# Patient Record
Sex: Male | Born: 1965 | Race: White | Hispanic: No | Marital: Married | Smoking: Former smoker
Health system: Southern US, Community
[De-identification: ages and names within clinical notes are randomized; demographics above are authoritative.]

---

## 2010-04-25 ENCOUNTER — Encounter: Admission: RE | Admit: 2010-04-25 | Discharge: 2010-04-25 | Payer: Self-pay | Admitting: Family Medicine

## 2010-11-16 ENCOUNTER — Encounter: Payer: Self-pay | Admitting: Family Medicine

## 2011-11-23 IMAGING — RF DG ESOPHAGUS
16 of 19 series · 19 of 24 positions shown · non-contrast
Comparison: None.

CLINICAL DATA: Dysphagia

ESOPHOGRAM/BARIUM SWALLOW
TECHNIQUE: Combined double contrast and single contrast
examination performed using effervescent crystals, thick barium
liquid, and thin barium liquid.
Fluoroscopy time:  1.8 minutes.

[Series 2: run · 1 of 1 slices shown (1 of 16)]
[im 1/1]
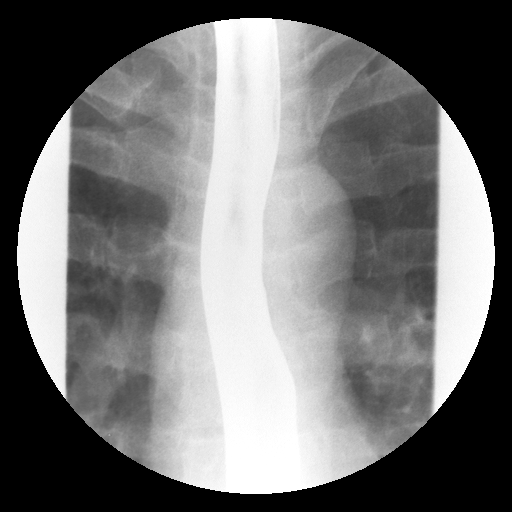

[Series 3: run · 1 of 1 slices shown (2 of 16)]
[im 1/1]
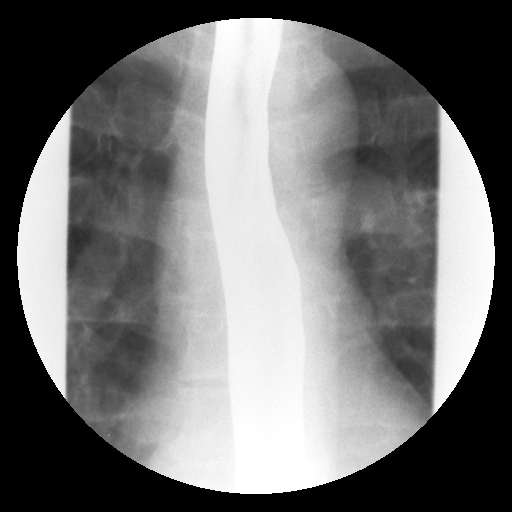

[Series 5: run · 1 of 1 slices shown (3 of 16)]
[im 1/1]
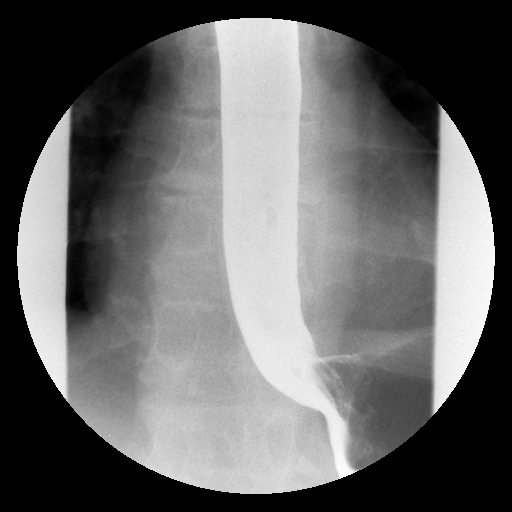

[Series 6: run · 1 of 1 slices shown (4 of 16)]
[im 1/1]
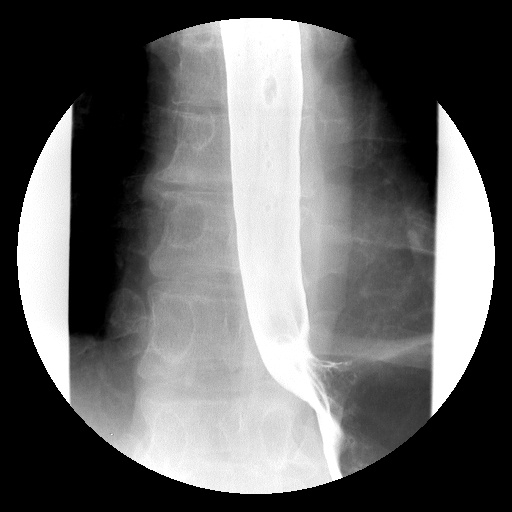

[Series 7: run · 1 of 1 slices shown (5 of 16)]
[im 1/1]
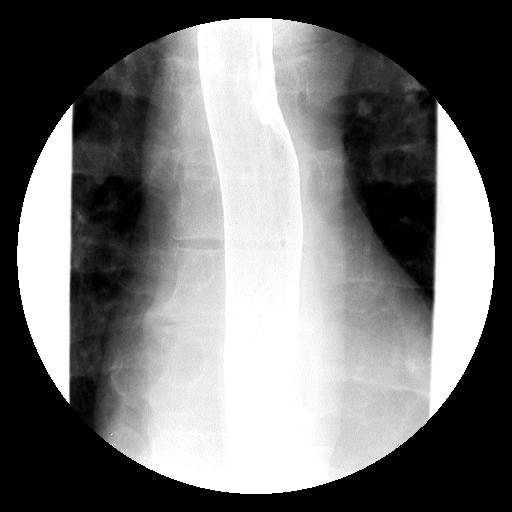

[Series 8: run · 1 of 1 slices shown (6 of 16)]
[im 1/1]
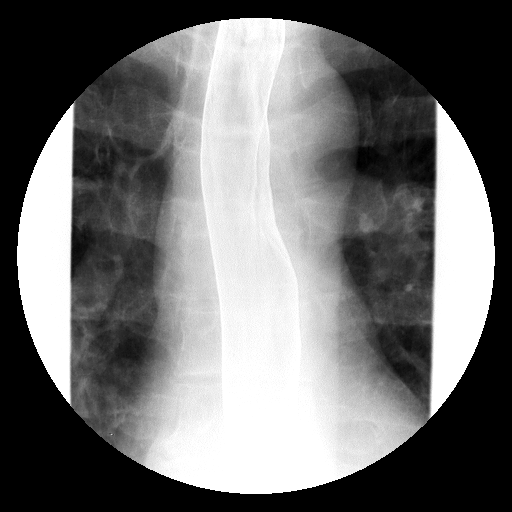

[Series 9: run · 3 of 7 slices shown (7 of 16)]
[im 3/7]
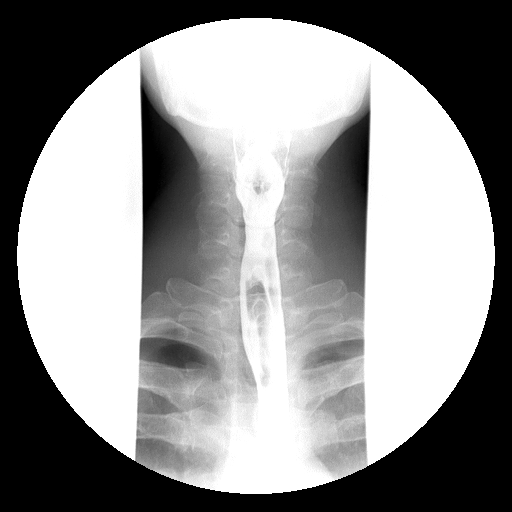
[im 5/7]
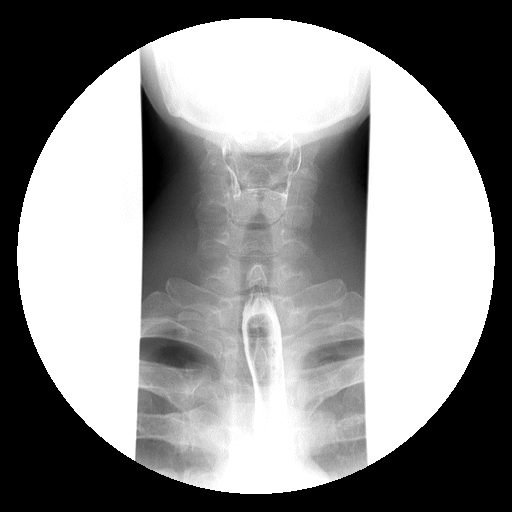
[im 7/7]
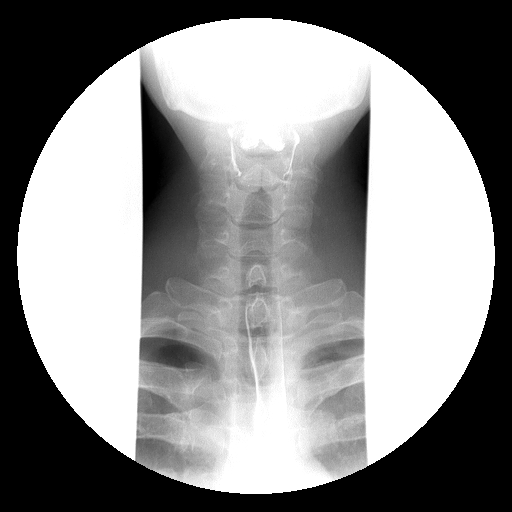

[Series 10: run · 2 of 5 slices shown (8 of 16)]
[im 3/5]
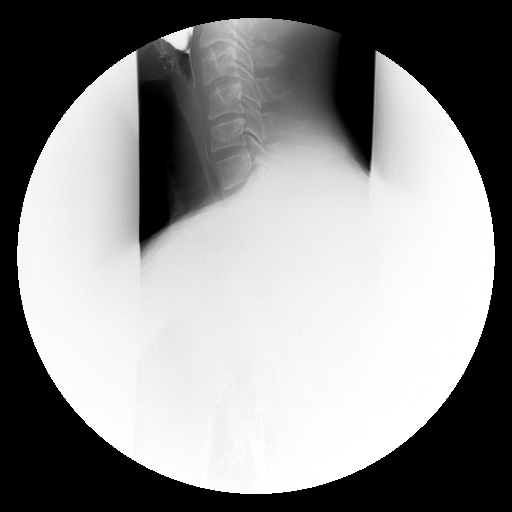
[im 5/5]
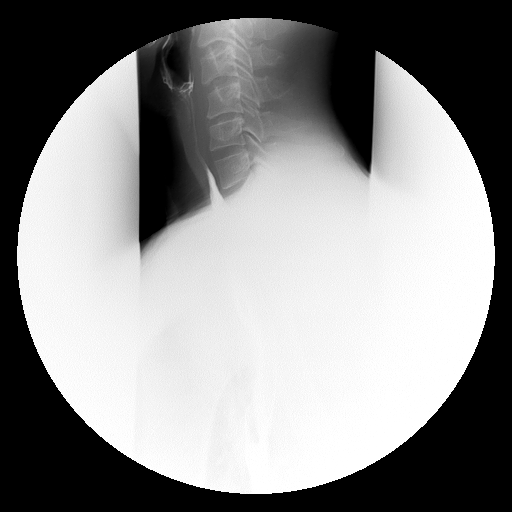

[Series 11: run · 1 of 1 slices shown (9 of 16)]
[im 1/1]
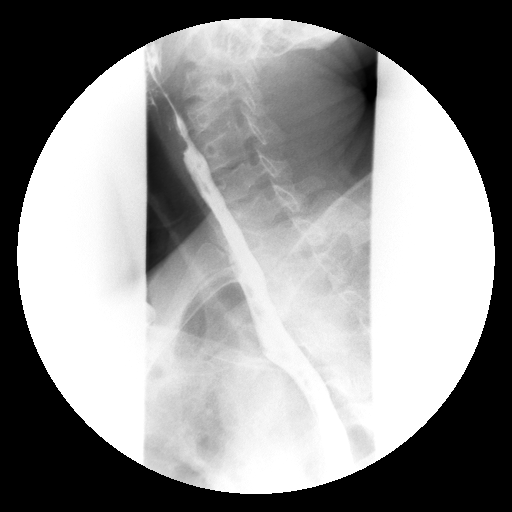

[Series 12: run · 1 of 1 slices shown (10 of 16)]
[im 1/1]
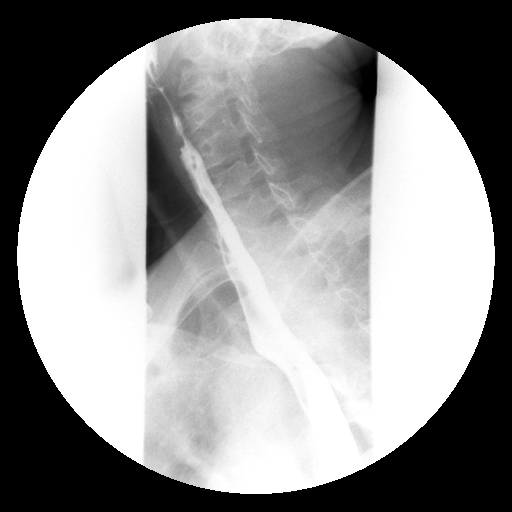

[Series 14: run · 1 of 1 slices shown (11 of 16)]
[im 1/1]
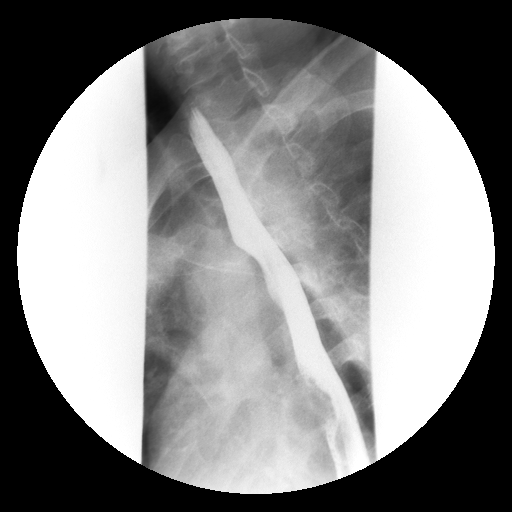

[Series 15: run · 1 of 1 slices shown (12 of 16)]
[im 1/1]
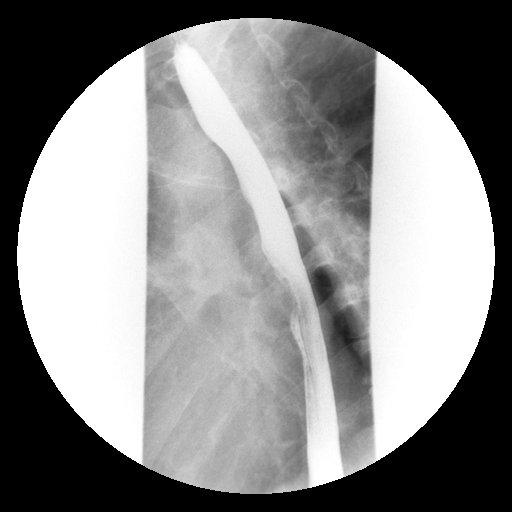

[Series 16: run · 1 of 1 slices shown (13 of 16)]
[im 1/1]
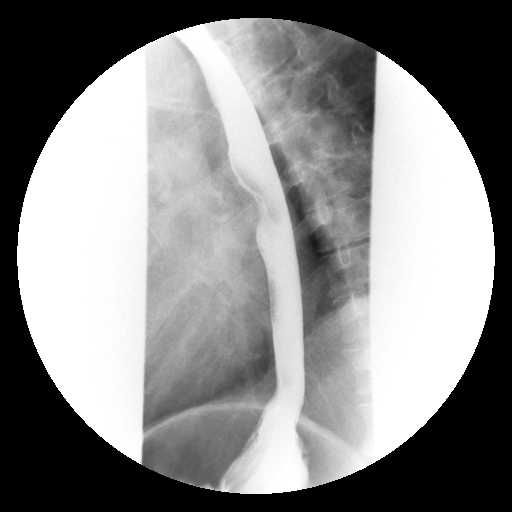

[Series 17: run · 1 of 1 slices shown (14 of 16)]
[im 1/1]
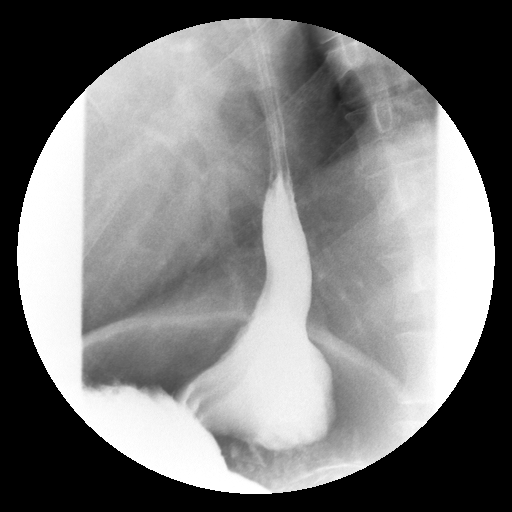

[Series 19: run · 1 of 1 slices shown (15 of 16)]
[im 1/1]
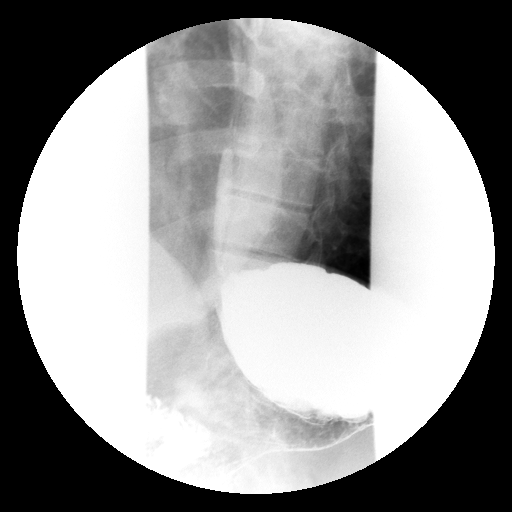

[Series 20: run · 1 of 1 slices shown (16 of 16)]
[im 1/1]
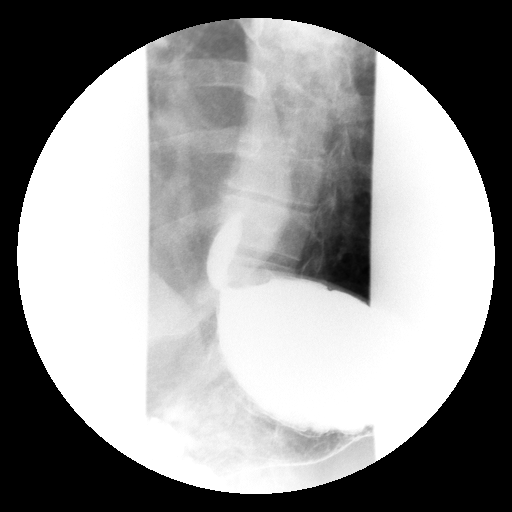

[19 of 24 positions shown; findings below may reference images not displayed]

FINDINGS: Initially a double contrast barium swallow was performed
and the mucosa of the esophagus appears normal.  A single contrast
study shows the swallowing mechanism to be normal.  Esophageal
peristalsis is normal.  No hiatal hernia is seen.  There is mild
gastroesophageal reflux demonstrated.  A barium pill was given at
the end of the study which did pass into the stomach without delay.
IMPRESSION: Mild gastroesophageal reflux.  The barium pill passes into the
stomach without delay.

## 2021-08-19 ENCOUNTER — Ambulatory Visit (AMBULATORY_SURGERY_CENTER): Payer: Self-pay | Admitting: *Deleted

## 2021-08-19 ENCOUNTER — Other Ambulatory Visit: Payer: Self-pay

## 2021-08-19 VITALS — Ht 69.0 in | Wt 150.0 lb

## 2021-08-19 DIAGNOSIS — Z1211 Encounter for screening for malignant neoplasm of colon: Secondary | ICD-10-CM

## 2021-08-19 MED ORDER — SUTAB 1479-225-188 MG PO TABS: 1.0000 | ORAL_TABLET | Freq: Once | ORAL | 0 refills | Status: AC

## 2021-08-19 NOTE — Progress Notes (Signed)
Pt's previsit is done over the phone and all paperwork (prep instructions, blank consent form to just read over) sent to patient.  Pt's name and DOB verified at the beginning of the previsit.  Pt denies any difficulty with ambulating.   No surgeries, no trouble moving neck   No egg or soy allergy  No home oxygen use   No medications for weight loss taken  emmi information given  Pt denies constipation issues  Pt informed that we do not do prior authorizations for prep  Pt requests pills for prep.  Sutab prep given and coupon sent with paperwork.  He is aware of the cost

## 2021-09-03 ENCOUNTER — Encounter: Payer: Self-pay | Admitting: Internal Medicine

## 2021-09-15 ENCOUNTER — Encounter: Payer: Self-pay | Admitting: Internal Medicine

## 2021-09-16 ENCOUNTER — Ambulatory Visit (AMBULATORY_SURGERY_CENTER): Payer: Managed Care, Other (non HMO) | Admitting: Internal Medicine

## 2021-09-16 ENCOUNTER — Encounter: Payer: Self-pay | Admitting: Internal Medicine

## 2021-09-16 VITALS — BP 107/68 | HR 69 | Temp 96.8°F | Resp 9 | Ht 69.0 in | Wt 150.0 lb

## 2021-09-16 DIAGNOSIS — Z1211 Encounter for screening for malignant neoplasm of colon: Secondary | ICD-10-CM | POA: Diagnosis not present

## 2021-09-16 MED ORDER — SODIUM CHLORIDE 0.9 % IV SOLN: 500.0000 mL | Freq: Once | INTRAVENOUS | Status: AC

## 2021-09-16 NOTE — Progress Notes (Signed)
[facility] History and Physical   Primary Care Physician:  Pcp, No   Reason for Procedure:   Colon cancer screening  Plan:    colonoscopy     HPI: James Prince is a 55 y.o. male here for screening colonoscopy   History reviewed. No pertinent past medical history.  History reviewed. No pertinent surgical history.  Prior to Admission medications   Medication Sig Start Date End Date Taking? Authorizing Provider  IBUPROFEN PO Take by mouth as needed.    [provider]    Current Outpatient Medications  Medication Sig Dispense Refill   IBUPROFEN PO Take by mouth as needed.     Current Facility-Administered Medications  Medication Dose Route Frequency Provider Last Rate Last Admin   0.9 %  sodium chloride infusion  500 mL Intravenous Once Iva Boop, MD        Allergies as of 09/16/2021   (No Known Allergies)    Family History  Problem Relation Age of Onset   Prostate cancer Maternal Uncle    Colon cancer Neg Hx    Esophageal cancer Neg Hx    Rectal cancer Neg Hx    Stomach cancer Neg Hx     Social History   Socioeconomic History   Marital status: Married    Spouse name: Not on file   Number of children: Not on file   Years of education: Not on file   Highest education level: Not on file  Occupational History   Not on file  Tobacco Use   Smoking status: Former    Types: Cigarettes   Smokeless tobacco: Former  Building services engineer Use: Never used  Substance and Sexual Activity   Alcohol use: Never   Drug use: Never   Review of Systems:  All other review of systems negative except as mentioned in the HPI.  Physical Exam: Vital signs BP 104/75   Pulse 75   Temp (!) 96.8 F (36 C)   Resp (!) 9   Ht 5\' 9"  (1.753 m)   Wt 150 lb (68 kg)   SpO2 100%   BMI 22.15 kg/m   General:   Alert,  Well-developed, well-nourished, pleasant and cooperative in NAD Lungs:  Clear throughout to auscultation.   Heart:  Regular rate  and rhythm; no murmurs, clicks, rubs,  or gallops. Abdomen:  Soft, nontender and nondistended. Normal bowel sounds.   Neuro/Psych:  Alert and cooperative. Normal mood and affect. A and O x 3   @[name]  , MD, [facility] [phone] (pager) 09/16/2021 3:14 PM@

## 2021-09-16 NOTE — Op Note (Signed)
[facility] Patient Name: James Prince Procedure Date: 09/16/2021 3:12 PM MRN: [id number] Endoscopist: Iva Boop , MD Age: 55 Referring MD:  Date of Birth: 1966/10/23 Gender: Male Account #: [id number] Procedure:                Colonoscopy Indications:              Screening for colorectal malignant neoplasm, This                            is the patient's first colonoscopy Medicines:                Propofol per Anesthesia, Monitored Anesthesia Care Procedure:                Pre-Anesthesia Assessment:                           - Prior to the procedure, a History and Physical                            was performed, and patient medications and                            allergies were reviewed. The patient's tolerance of                            previous anesthesia was also reviewed. The risks                            and benefits of the procedure and the sedation                            options and risks were discussed with the patient.                            All questions were answered, and informed consent                            was obtained. Prior Anticoagulants: The patient has                            taken no previous anticoagulant or antiplatelet                            agents. ASA Grade Assessment: I - A normal, healthy                            patient. After reviewing the risks and benefits,                            the patient was deemed in satisfactory condition to                            undergo the procedure.  After obtaining informed consent, the colonoscope                            was passed under direct vision. Throughout the                            procedure, the patient's blood pressure, pulse, and                            oxygen saturations were monitored continuously. The                            Olympus CF-HQ190L [id number]) Colonoscope was                            introduced through  the anus and advanced to the the                            cecum, identified by appendiceal orifice and                            ileocecal valve. The colonoscopy was performed                            without difficulty. The patient tolerated the                            procedure well. The quality of the bowel                            preparation was excellent. The ileocecal valve,                            appendiceal orifice, and rectum were photographed.                            The bowel preparation used was SUTAB via split dose                            instruction. Scope In: 3:23:32 PM Scope Out: 3:37:19 PM Scope Withdrawal Time: 0 hours 8 minutes 46 seconds  Total Procedure Duration: 0 hours 13 minutes 47 seconds  Findings:                 The perianal and digital rectal examinations were                            normal. Pertinent negatives include normal prostate                            (size, shape, and consistency).                           The entire examined colon appeared normal on direct  and retroflexion views. Complications:            No immediate complications. Estimated Blood Loss:     Estimated blood loss: none. Impression:               - The entire examined colon is normal on direct and                            retroflexion views.                           - No specimens collected. Recommendation:           - Patient has a contact number available for                            emergencies. The signs and symptoms of potential                            delayed complications were discussed with the                            patient. Return to normal activities tomorrow.                            Written discharge instructions were provided to the                            patient.                           - Resume previous diet.                           - Continue present medications.                           -  Repeat colonoscopy in 10 years for screening                            purposes. Iva Boop, MD 09/16/2021 3:46:10 PM This report has been signed electronically.

## 2021-09-16 NOTE — Progress Notes (Signed)
Pt's states no medical or surgical changes since previsit or office visit.   VS taken by DT 

## 2021-09-16 NOTE — Progress Notes (Signed)
To PACU, VSS. Report to [email or web address] 

## 2021-09-16 NOTE — Patient Instructions (Addendum)
No polyps or cancer were seen. Normal exam!  Next routine colonoscopy or other screening test in 10 years - 2032.  I appreciate the opportunity to care for you. Iva Boop, MD, [facility]  Resume previous diet and medications.  YOU HAD AN ENDOSCOPIC PROCEDURE TODAY AT THE [facility]:   Refer to the procedure report that was given to you for any specific questions about what was found during the examination.  If the procedure report does not answer your questions, please call your gastroenterologist to clarify.  If you requested that your care partner not be given the details of your procedure findings, then the procedure report has been included in a sealed envelope for you to review at your convenience later.  YOU SHOULD EXPECT: Some feelings of bloating in the abdomen. Passage of more gas than usual.  Walking can help get rid of the air that was put into your GI tract during the procedure and reduce the bloating. If you had a lower endoscopy (such as a colonoscopy or flexible sigmoidoscopy) you may notice spotting of blood in your stool or on the toilet paper. If you underwent a bowel prep for your procedure, you may not have a normal bowel movement for a few days.  Please Note:  You might notice some irritation and congestion in your nose or some drainage.  This is from the oxygen used during your procedure.  There is no need for concern and it should clear up in a day or so.  SYMPTOMS TO REPORT IMMEDIATELY:  Following lower endoscopy (colonoscopy or flexible sigmoidoscopy):  Excessive amounts of blood in the stool  Significant tenderness or worsening of abdominal pains  Swelling of the abdomen that is new, acute  Fever of 100F or higher  For urgent or emergent issues, a gastroenterologist can be reached at any hour by calling [phone]. Do not use MyChart messaging for urgent concerns.    DIET:  We do recommend a small meal at first, but then you may proceed to your  regular diet.  Drink plenty of fluids but you should avoid alcoholic beverages for 24 hours.  ACTIVITY:  You should plan to take it easy for the rest of today and you should NOT DRIVE or use heavy machinery until tomorrow (because of the sedation medicines used during the test).    FOLLOW UP: Our staff will call the number listed on your records 48-72 hours following your procedure to check on you and address any questions or concerns that you may have regarding the information given to you following your procedure. If we do not reach you, we will leave a message.  We will attempt to reach you two times.  During this call, we will ask if you have developed any symptoms of COVID 19. If you develop any symptoms (ie: fever, flu-like symptoms, shortness of breath, cough etc.) before then, please call [phone].  If you test positive for Covid 19 in the 2 weeks post procedure, please call and report this information to [place].    If any biopsies were taken you will be contacted by phone or by letter within the next 1-3 weeks.  Please call us at [phone] if you have not heard about the biopsies in 3 weeks.    SIGNATURES/CONFIDENTIALITY: You and/or your care partner have signed paperwork which will be entered into your electronic medical record.  These signatures attest to the fact that that the information above on your After Visit Summary  has been reviewed and is understood.  Full responsibility of the confidentiality of this discharge information lies with you and/or your care-partner.  

## 2021-09-22 ENCOUNTER — Telehealth: Payer: Self-pay | Admitting: *Deleted

## 2021-09-22 NOTE — Telephone Encounter (Signed)
  Follow up Call-  Call back number 09/16/2021  Post procedure Call Back phone  # [phone]  Permission to leave phone message Yes  Some recent data might be hidden     Patient questions:  Do you have a fever, pain , or abdominal swelling? No. Pain Score  0 *  Have you tolerated food without any problems? Yes.    Have you been able to return to your normal activities? Yes.    Do you have any questions about your discharge instructions: Diet   No. Medications  No. Follow up visit  No.  Do you have questions or concerns about your Care? No.  Actions: * If pain score is 4 or above: No action needed, pain <4.  Have you developed a fever since your procedure? no  2.   Have you had an respiratory symptoms (SOB or cough) since your procedure? no  3.   Have you tested positive for COVID 19 since your procedure no  4.   Have you had any family members/close contacts diagnosed with the COVID 19 since your procedure?  no   If yes to any of these questions please route to Laverna Peace, RN and Karlton Lemon, RN

## 2022-04-05 DIAGNOSIS — H1032 Unspecified acute conjunctivitis, left eye: Secondary | ICD-10-CM | POA: Diagnosis not present

## 2022-04-07 DIAGNOSIS — H5712 Ocular pain, left eye: Secondary | ICD-10-CM | POA: Diagnosis not present

## 2022-04-07 DIAGNOSIS — T1502XA Foreign body in cornea, left eye, initial encounter: Secondary | ICD-10-CM | POA: Diagnosis not present

## 2022-08-19 DIAGNOSIS — Z1331 Encounter for screening for depression: Secondary | ICD-10-CM | POA: Diagnosis not present

## 2022-08-19 DIAGNOSIS — Z0000 Encounter for general adult medical examination without abnormal findings: Secondary | ICD-10-CM | POA: Diagnosis not present

## 2022-08-19 DIAGNOSIS — E7800 Pure hypercholesterolemia, unspecified: Secondary | ICD-10-CM | POA: Diagnosis not present

## 2023-09-26 DEATH — deceased
